# Patient Record
Sex: Female | Born: 1960 | Race: White | Hispanic: No | Marital: Married | Smoking: Never smoker
Health system: Southern US, Community
[De-identification: ages and names within clinical notes are randomized; demographics above are authoritative.]

## PROBLEM LIST (undated history)

## (undated) DIAGNOSIS — T7840XA Allergy, unspecified, initial encounter: Secondary | ICD-10-CM

## (undated) DIAGNOSIS — I10 Essential (primary) hypertension: Secondary | ICD-10-CM

## (undated) DIAGNOSIS — F419 Anxiety disorder, unspecified: Secondary | ICD-10-CM

## (undated) DIAGNOSIS — E785 Hyperlipidemia, unspecified: Secondary | ICD-10-CM

## (undated) HISTORY — DX: Anxiety disorder, unspecified: F41.9

## (undated) HISTORY — DX: Allergy, unspecified, initial encounter: T78.40XA

## (undated) HISTORY — DX: Essential (primary) hypertension: I10

## (undated) HISTORY — PX: WISDOM TOOTH EXTRACTION: SHX21

## (undated) HISTORY — DX: Hyperlipidemia, unspecified: E78.5

---

## 2015-12-01 DIAGNOSIS — G5603 Carpal tunnel syndrome, bilateral upper limbs: Secondary | ICD-10-CM | POA: Diagnosis not present

## 2015-12-01 DIAGNOSIS — M7989 Other specified soft tissue disorders: Secondary | ICD-10-CM | POA: Diagnosis not present

## 2016-01-12 DIAGNOSIS — F419 Anxiety disorder, unspecified: Secondary | ICD-10-CM | POA: Diagnosis not present

## 2016-01-12 DIAGNOSIS — R21 Rash and other nonspecific skin eruption: Secondary | ICD-10-CM | POA: Diagnosis not present

## 2016-03-27 DIAGNOSIS — H6981 Other specified disorders of Eustachian tube, right ear: Secondary | ICD-10-CM | POA: Diagnosis not present

## 2016-03-27 DIAGNOSIS — J029 Acute pharyngitis, unspecified: Secondary | ICD-10-CM | POA: Diagnosis not present

## 2016-04-12 DIAGNOSIS — R1032 Left lower quadrant pain: Secondary | ICD-10-CM | POA: Diagnosis not present

## 2016-05-09 DIAGNOSIS — Z23 Encounter for immunization: Secondary | ICD-10-CM | POA: Diagnosis not present

## 2016-11-07 DIAGNOSIS — R21 Rash and other nonspecific skin eruption: Secondary | ICD-10-CM | POA: Diagnosis not present

## 2016-11-27 DIAGNOSIS — J329 Chronic sinusitis, unspecified: Secondary | ICD-10-CM | POA: Diagnosis not present

## 2016-12-14 DIAGNOSIS — R05 Cough: Secondary | ICD-10-CM | POA: Diagnosis not present

## 2017-03-08 DIAGNOSIS — K047 Periapical abscess without sinus: Secondary | ICD-10-CM | POA: Diagnosis not present

## 2017-05-13 DIAGNOSIS — Z23 Encounter for immunization: Secondary | ICD-10-CM | POA: Diagnosis not present

## 2018-03-10 DIAGNOSIS — J0100 Acute maxillary sinusitis, unspecified: Secondary | ICD-10-CM | POA: Diagnosis not present

## 2018-03-18 DIAGNOSIS — N926 Irregular menstruation, unspecified: Secondary | ICD-10-CM | POA: Diagnosis not present

## 2018-03-18 DIAGNOSIS — Z0000 Encounter for general adult medical examination without abnormal findings: Secondary | ICD-10-CM | POA: Diagnosis not present

## 2018-06-27 DIAGNOSIS — Z6834 Body mass index (BMI) 34.0-34.9, adult: Secondary | ICD-10-CM | POA: Diagnosis not present

## 2018-06-27 DIAGNOSIS — I10 Essential (primary) hypertension: Secondary | ICD-10-CM | POA: Diagnosis not present

## 2018-11-25 DIAGNOSIS — L989 Disorder of the skin and subcutaneous tissue, unspecified: Secondary | ICD-10-CM | POA: Diagnosis not present

## 2020-12-21 DIAGNOSIS — E559 Vitamin D deficiency, unspecified: Secondary | ICD-10-CM | POA: Diagnosis not present

## 2020-12-21 DIAGNOSIS — R5383 Other fatigue: Secondary | ICD-10-CM | POA: Diagnosis not present

## 2020-12-21 DIAGNOSIS — I10 Essential (primary) hypertension: Secondary | ICD-10-CM | POA: Diagnosis not present

## 2020-12-21 DIAGNOSIS — Z79899 Other long term (current) drug therapy: Secondary | ICD-10-CM | POA: Diagnosis not present

## 2020-12-21 DIAGNOSIS — R7303 Prediabetes: Secondary | ICD-10-CM | POA: Diagnosis not present

## 2020-12-26 DIAGNOSIS — E538 Deficiency of other specified B group vitamins: Secondary | ICD-10-CM | POA: Diagnosis not present

## 2020-12-26 DIAGNOSIS — I10 Essential (primary) hypertension: Secondary | ICD-10-CM | POA: Diagnosis not present

## 2020-12-26 DIAGNOSIS — F988 Other specified behavioral and emotional disorders with onset usually occurring in childhood and adolescence: Secondary | ICD-10-CM | POA: Diagnosis not present

## 2020-12-26 DIAGNOSIS — R7303 Prediabetes: Secondary | ICD-10-CM | POA: Diagnosis not present

## 2020-12-28 DIAGNOSIS — E538 Deficiency of other specified B group vitamins: Secondary | ICD-10-CM | POA: Diagnosis not present

## 2021-01-25 DIAGNOSIS — B029 Zoster without complications: Secondary | ICD-10-CM | POA: Diagnosis not present

## 2021-01-31 DIAGNOSIS — E538 Deficiency of other specified B group vitamins: Secondary | ICD-10-CM | POA: Diagnosis not present

## 2021-03-07 DIAGNOSIS — E538 Deficiency of other specified B group vitamins: Secondary | ICD-10-CM | POA: Diagnosis not present

## 2021-04-11 DIAGNOSIS — E538 Deficiency of other specified B group vitamins: Secondary | ICD-10-CM | POA: Diagnosis not present

## 2021-05-12 DIAGNOSIS — F411 Generalized anxiety disorder: Secondary | ICD-10-CM | POA: Diagnosis not present

## 2021-05-12 DIAGNOSIS — I10 Essential (primary) hypertension: Secondary | ICD-10-CM | POA: Diagnosis not present

## 2021-05-12 DIAGNOSIS — R197 Diarrhea, unspecified: Secondary | ICD-10-CM | POA: Diagnosis not present

## 2021-05-15 ENCOUNTER — Encounter: Payer: Self-pay | Admitting: Internal Medicine

## 2021-05-15 DIAGNOSIS — R7303 Prediabetes: Secondary | ICD-10-CM | POA: Diagnosis not present

## 2021-05-15 DIAGNOSIS — R197 Diarrhea, unspecified: Secondary | ICD-10-CM | POA: Diagnosis not present

## 2021-05-24 DIAGNOSIS — R197 Diarrhea, unspecified: Secondary | ICD-10-CM | POA: Diagnosis not present

## 2021-05-31 DIAGNOSIS — E538 Deficiency of other specified B group vitamins: Secondary | ICD-10-CM | POA: Diagnosis not present

## 2021-07-03 DIAGNOSIS — E538 Deficiency of other specified B group vitamins: Secondary | ICD-10-CM | POA: Diagnosis not present

## 2021-07-14 ENCOUNTER — Encounter: Payer: Self-pay | Admitting: Internal Medicine

## 2021-07-18 ENCOUNTER — Encounter: Payer: Self-pay | Admitting: Gastroenterology

## 2021-08-02 DIAGNOSIS — N950 Postmenopausal bleeding: Secondary | ICD-10-CM | POA: Diagnosis not present

## 2021-08-31 DIAGNOSIS — N950 Postmenopausal bleeding: Secondary | ICD-10-CM | POA: Diagnosis not present

## 2021-09-08 ENCOUNTER — Ambulatory Visit (AMBULATORY_SURGERY_CENTER): Payer: Self-pay | Admitting: *Deleted

## 2021-09-08 ENCOUNTER — Other Ambulatory Visit: Payer: Self-pay

## 2021-09-08 VITALS — Ht 62.25 in | Wt 190.0 lb

## 2021-09-08 DIAGNOSIS — Z1211 Encounter for screening for malignant neoplasm of colon: Secondary | ICD-10-CM

## 2021-09-08 MED ORDER — NA SULFATE-K SULFATE-MG SULF 17.5-3.13-1.6 GM/177ML PO SOLN: 1.0000 | Freq: Once | ORAL | 0 refills | Status: AC

## 2021-09-08 NOTE — Progress Notes (Signed)
No egg or soy allergy known to patient  No issues known to pt with past sedation with any surgeries or procedures Patient denies ever being told they had issues or difficulty with intubation  No FH of Malignant Hyperthermia Pt is not on diet pills Pt is not on  home 02  Pt is not on blood thinners  Pt denies issues with constipation  No A fib or A flutter  Pt is  vaccinated  for Covid   Due to the COVID-19 pandemic we are asking patients to follow certain guidelines in PV and the [place]   Pt aware of COVID protocols and [facility] guidelines   PV completed over the phone. Pt verified name, DOB, address and insurance during PV today.  Pt mailed instruction packet with copy of consent form to read and not return, and instructions.  Pt encouraged to call with questions or issues.

## 2021-09-19 DIAGNOSIS — E538 Deficiency of other specified B group vitamins: Secondary | ICD-10-CM | POA: Diagnosis not present

## 2021-09-20 ENCOUNTER — Encounter: Payer: Self-pay | Admitting: Gastroenterology

## 2021-09-22 ENCOUNTER — Ambulatory Visit (AMBULATORY_SURGERY_CENTER): Payer: BC Managed Care – PPO | Admitting: Gastroenterology

## 2021-09-22 ENCOUNTER — Encounter: Payer: Self-pay | Admitting: Gastroenterology

## 2021-09-22 ENCOUNTER — Other Ambulatory Visit: Payer: Self-pay

## 2021-09-22 VITALS — BP 120/62 | HR 78 | Temp 98.6°F | Resp 19 | Ht 62.5 in | Wt 190.0 lb

## 2021-09-22 DIAGNOSIS — Z1211 Encounter for screening for malignant neoplasm of colon: Secondary | ICD-10-CM

## 2021-09-22 DIAGNOSIS — D123 Benign neoplasm of transverse colon: Secondary | ICD-10-CM

## 2021-09-22 DIAGNOSIS — D128 Benign neoplasm of rectum: Secondary | ICD-10-CM | POA: Diagnosis not present

## 2021-09-22 MED ORDER — SODIUM CHLORIDE 0.9 % IV SOLN: 500.0000 mL | Freq: Once | INTRAVENOUS | Status: DC

## 2021-09-22 NOTE — Progress Notes (Signed)
[facility] History and Physical   Primary Care Physician:  Ernestene Kiel, MD   Reason for Procedure:  Colorectal cancer screening  Plan:    Colonoscopy     HPI: Jacqueline Sherman is a 61 y.o. female    Past Medical History:  Diagnosis Date   Allergy    SEASONAL   Anxiety    Hyperlipidemia    BORDERLINE MED'S ONCE WEEKLY   Hypertension     Past Surgical History:  Procedure Laterality Date   CESAREAN SECTION     1996   WISDOM TOOTH EXTRACTION     1991    Prior to Admission medications   Medication Sig Start Date End Date Taking? Authorizing Provider  citalopram (CELEXA) 20 MG tablet Take 1 tablet by mouth daily.   Yes [provider]  cyanocobalamin (,VITAMIN B-12,) 1000 MCG/ML injection SMARTSIG:1 Milliliter(s) Injection Once a Month 08/23/21  Yes [provider]  hydrochlorothiazide (HYDRODIURIL) 12.5 MG tablet Take 12.5 mg by mouth every morning. 08/25/21  Yes [provider]  metoprolol succinate (TOPROL-XL) 50 MG 24 hr tablet Take 50 mg by mouth daily. 08/25/21  Yes [provider]  Vitamin D, Ergocalciferol, (DRISDOL) 1.25 MG (50000 UNIT) CAPS capsule Take by mouth. 08/23/21  Yes [provider]  LORazepam (ATIVAN) 0.5 MG tablet Take by mouth.    [provider]    Current Outpatient Medications  Medication Sig Dispense Refill   citalopram (CELEXA) 20 MG tablet Take 1 tablet by mouth daily.     cyanocobalamin (,VITAMIN B-12,) 1000 MCG/ML injection SMARTSIG:1 Milliliter(s) Injection Once a Month     hydrochlorothiazide (HYDRODIURIL) 12.5 MG tablet Take 12.5 mg by mouth every morning.     metoprolol succinate (TOPROL-XL) 50 MG 24 hr tablet Take 50 mg by mouth daily.     Vitamin D, Ergocalciferol, (DRISDOL) 1.25 MG (50000 UNIT) CAPS capsule Take by mouth.     LORazepam (ATIVAN) 0.5 MG tablet Take by mouth.     Current Facility-Administered Medications  Medication Dose Route Frequency Provider Last  Rate Last Admin   0.9 %  sodium chloride infusion  500 mL Intravenous Once Jackquline Denmark, MD        Allergies as of 09/22/2021   (No Known Allergies)    Family History  Problem Relation Age of Onset   Colon polyps Mother    Colon cancer Neg Hx    Esophageal cancer Neg Hx    Rectal cancer Neg Hx    Stomach cancer Neg Hx     Social History   Socioeconomic History   Marital status: Married    Spouse name: Not on file   Number of children: Not on file   Years of education: Not on file   Highest education level: Not on file  Occupational History   Not on file  Tobacco Use   Smoking status: Never   Smokeless tobacco: Never  Vaping Use   Vaping Use: Never used  Substance and Sexual Activity   Alcohol use: Yes    Comment: OCCASSIONAL   Drug use: Never   Sexual activity: Not on file  Other Topics Concern   Not on file  Social History Narrative   Not on file   Social Determinants of Health   Financial Resource Strain: Not on file  Food Insecurity: Not on file  Transportation Needs: Not on file  Physical Activity: Not on file  Stress: Not on file  Social Connections: Not on file  Intimate Partner Violence:  Not on file    Review of Systems: Positive for none All other review of systems negative except as mentioned in the HPI.  Physical Exam: Vital signs in last 24 hours: @VSRANGES @   General:   Alert,  Well-developed, well-nourished, pleasant and cooperative in NAD Lungs:  Clear throughout to auscultation.   Heart:  Regular rate and rhythm; no murmurs, clicks, rubs,  or gallops. Abdomen:  Soft, nontender and nondistended. Normal bowel sounds.   Neuro/Psych:  Alert and cooperative. Normal mood and affect. A and O x 3    No significant changes were identified.  The patient continues to be an appropriate candidate for the planned procedure and anesthesia.   Carmell Austria, MD. [facility] 09/22/2021 8:06 AM@

## 2021-09-22 NOTE — Progress Notes (Signed)
Called to room to assist during endoscopic procedure.  Patient ID and intended procedure confirmed with present staff. Received instructions for my participation in the procedure from the performing physician.  

## 2021-09-22 NOTE — Progress Notes (Signed)
Pt's states no medical or surgical changes since previsit or office visit.  VS CW  

## 2021-09-22 NOTE — Op Note (Signed)
[facility] Patient Name: Mylissa Lambe Procedure Date: 09/22/2021 7:15 AM MRN: [id number] Endoscopist: Jackquline Denmark , MD Age: 61 Referring MD:  Date of Birth: 1961/01/08 Gender: Female Account #: [id number] Procedure:                Colonoscopy Indications:              Screening for colorectal malignant neoplasm Medicines:                Monitored Anesthesia Care Procedure:                Pre-Anesthesia Assessment:                           - Prior to the procedure, a History and Physical                            was performed, and patient medications and                            allergies were reviewed. The patient's tolerance of                            previous anesthesia was also reviewed. The risks                            and benefits of the procedure and the sedation                            options and risks were discussed with the patient.                            All questions were answered, and informed consent                            was obtained. Prior Anticoagulants: The patient has                            taken no previous anticoagulant or antiplatelet                            agents. ASA Grade Assessment: II - A patient with                            mild systemic disease. After reviewing the risks                            and benefits, the patient was deemed in                            satisfactory condition to undergo the procedure.                           After obtaining informed consent, the colonoscope  was passed under direct vision. Throughout the                            procedure, the patient's blood pressure, pulse, and                            oxygen saturations were monitored continuously. The                            Olympus CF-HQ190L (Serial# [id number]) Colonoscope was                            introduced through the anus and advanced to the the                            cecum,  identified by appendiceal orifice and                            ileocecal valve. The colonoscopy was performed                            without difficulty. The patient tolerated the                            procedure well. The quality of the bowel                            preparation was good. The ileocecal valve,                            appendiceal orifice, and rectum were photographed. Scope In: 8:11:28 AM Scope Out: 8:22:21 AM Scope Withdrawal Time: 0 hours 7 minutes 7 seconds  Total Procedure Duration: 0 hours 10 minutes 53 seconds  Findings:                 Three sessile polyps were found in the proximal                            transverse colon (2) and in the rectum. The polyps                            were 4 to 6 mm in size. These polyps were removed                            with a cold snare. Resection and retrieval were                            complete.                           A few rare (1-2) small-mouthed diverticula were                            found in the sigmoid colon.  Non-bleeding external and internal hemorrhoids were                            found during retroflexion and during perianal exam.                            The hemorrhoids were small.                           The exam was otherwise without abnormality on                            direct and retroflexion views. Complications:            No immediate complications. Estimated Blood Loss:     Estimated blood loss: none. Impression:               - Three 4 to 6 mm polyps in the proximal transverse                            colon/rectum, removed with a cold snare. Resected                            and retrieved.                           - Minimal sigmoid diverticulosis.                           - Non-bleeding external and internal hemorrhoids.                           - The examination was otherwise normal on direct                            and  retroflexion views. Recommendation:           - Patient has a contact number available for                            emergencies. The signs and symptoms of potential                            delayed complications were discussed with the                            patient. Return to normal activities tomorrow.                            Written discharge instructions were provided to the                            patient.                           - Resume previous diet.                           -  Continue present medications.                           - Await pathology results.                           - Repeat colonoscopy for surveillance based on                            pathology results.                           - The findings and recommendations were discussed                            with the patient's family. Jackquline Denmark, MD 09/22/2021 8:29:28 AM This report has been signed electronically.

## 2021-09-22 NOTE — Progress Notes (Signed)
Report given to PACU, vss 

## 2021-09-22 NOTE — Patient Instructions (Signed)
Read all handouts given to you today Await pathology results   YOU HAD AN ENDOSCOPIC PROCEDURE TODAY AT [facility]:   Refer to the procedure report that was given to you for any specific questions about what was found during the examination.  If the procedure report does not answer your questions, please call your gastroenterologist to clarify.  If you requested that your care partner not be given the details of your procedure findings, then the procedure report has been included in a sealed envelope for you to review at your convenience later.  YOU SHOULD EXPECT: Some feelings of bloating in the abdomen. Passage of more gas than usual.  Walking can help get rid of the air that was put into your GI tract during the procedure and reduce the bloating. If you had a lower endoscopy (such as a colonoscopy or flexible sigmoidoscopy) you may notice spotting of blood in your stool or on the toilet paper. If you underwent a bowel prep for your procedure, you may not have a normal bowel movement for a few days.  Please Note:  You might notice some irritation and congestion in your nose or some drainage.  This is from the oxygen used during your procedure.  There is no need for concern and it should clear up in a day or so.  SYMPTOMS TO REPORT IMMEDIATELY:  Following lower endoscopy (colonoscopy or flexible sigmoidoscopy):  Excessive amounts of blood in the stool  Significant tenderness or worsening of abdominal pains  Swelling of the abdomen that is new, acute  Fever of 100F or higher   For urgent or emergent issues, a gastroenterologist can be reached at any hour by calling [phone]. Do not use MyChart messaging for urgent concerns.    DIET:  We do recommend a small meal at first, but then you may proceed to your regular diet.  Drink plenty of fluids but you should avoid alcoholic beverages for 24 hours.  ACTIVITY:  You should plan to take it easy for the rest of today and you  should NOT DRIVE or use heavy machinery until tomorrow (because of the sedation medicines used during the test).    FOLLOW UP: Our staff will call the number listed on your records 48-72 hours following your procedure to check on you and address any questions or concerns that you may have regarding the information given to you following your procedure. If we do not reach you, we will leave a message.  We will attempt to reach you two times.  During this call, we will ask if you have developed any symptoms of COVID 19. If you develop any symptoms (ie: fever, flu-like symptoms, shortness of breath, cough etc.) before then, please call [phone].  If you test positive for Covid 19 in the 2 weeks post procedure, please call and report this information to [place].    If any biopsies were taken you will be contacted by phone or by letter within the next 1-3 weeks.  Please call us at [phone] if you have not heard about the biopsies in 3 weeks.    SIGNATURES/CONFIDENTIALITY: You and/or your care partner have signed paperwork which will be entered into your electronic medical record.  These signatures attest to the fact that that the information above on your After Visit Summary has been reviewed and is understood.  Full responsibility of the confidentiality of this discharge information lies with you and/or your care-partner.

## 2021-09-26 ENCOUNTER — Telehealth: Payer: Self-pay

## 2021-09-26 ENCOUNTER — Other Ambulatory Visit: Payer: Self-pay | Admitting: Internal Medicine

## 2021-09-26 DIAGNOSIS — Z1231 Encounter for screening mammogram for malignant neoplasm of breast: Secondary | ICD-10-CM

## 2021-09-26 NOTE — Telephone Encounter (Signed)
°  Follow up Call-  Call back number 09/22/2021  Post procedure Call Back phone  # [phone]  Permission to leave phone message Yes  Some recent data might be hidden    Left message

## 2021-09-26 NOTE — Telephone Encounter (Signed)
°  Follow up Call-  Call back number 09/22/2021  Post procedure Call Back phone  # [phone]  Permission to leave phone message Yes  Some recent data might be hidden     Attempted to call but no voicemail so no message left

## 2021-09-29 ENCOUNTER — Encounter: Payer: Self-pay | Admitting: Gastroenterology

## 2021-10-10 ENCOUNTER — Ambulatory Visit: Payer: BC Managed Care – PPO

## 2021-10-10 DIAGNOSIS — S025XXB Fracture of tooth (traumatic), initial encounter for open fracture: Secondary | ICD-10-CM | POA: Diagnosis not present

## 2021-10-10 DIAGNOSIS — B3731 Acute candidiasis of vulva and vagina: Secondary | ICD-10-CM | POA: Diagnosis not present

## 2021-10-10 DIAGNOSIS — R0683 Snoring: Secondary | ICD-10-CM | POA: Diagnosis not present

## 2021-10-17 ENCOUNTER — Ambulatory Visit
Admission: RE | Admit: 2021-10-17 | Discharge: 2021-10-17 | Disposition: A | Discharge status: Home / Self Care | Payer: BC Managed Care – PPO | Source: Ambulatory Visit | Attending: Internal Medicine | Admitting: Internal Medicine

## 2021-10-17 DIAGNOSIS — Z1231 Encounter for screening mammogram for malignant neoplasm of breast: Secondary | ICD-10-CM | POA: Diagnosis not present

## 2021-10-21 DIAGNOSIS — G4733 Obstructive sleep apnea (adult) (pediatric): Secondary | ICD-10-CM | POA: Diagnosis not present

## 2021-10-26 DIAGNOSIS — G4733 Obstructive sleep apnea (adult) (pediatric): Secondary | ICD-10-CM | POA: Diagnosis not present

## 2021-10-30 DIAGNOSIS — E538 Deficiency of other specified B group vitamins: Secondary | ICD-10-CM | POA: Diagnosis not present

## 2021-11-26 DIAGNOSIS — G4733 Obstructive sleep apnea (adult) (pediatric): Secondary | ICD-10-CM | POA: Diagnosis not present

## 2021-12-18 DIAGNOSIS — E538 Deficiency of other specified B group vitamins: Secondary | ICD-10-CM | POA: Diagnosis not present

## 2021-12-21 DIAGNOSIS — E669 Obesity, unspecified: Secondary | ICD-10-CM | POA: Diagnosis not present

## 2021-12-21 DIAGNOSIS — Z9989 Dependence on other enabling machines and devices: Secondary | ICD-10-CM | POA: Diagnosis not present

## 2021-12-21 DIAGNOSIS — G4733 Obstructive sleep apnea (adult) (pediatric): Secondary | ICD-10-CM | POA: Diagnosis not present

## 2021-12-26 DIAGNOSIS — G4733 Obstructive sleep apnea (adult) (pediatric): Secondary | ICD-10-CM | POA: Diagnosis not present

## 2022-01-25 DIAGNOSIS — Z6834 Body mass index (BMI) 34.0-34.9, adult: Secondary | ICD-10-CM | POA: Diagnosis not present

## 2022-01-25 DIAGNOSIS — H6991 Unspecified Eustachian tube disorder, right ear: Secondary | ICD-10-CM | POA: Diagnosis not present

## 2022-01-26 DIAGNOSIS — G4733 Obstructive sleep apnea (adult) (pediatric): Secondary | ICD-10-CM | POA: Diagnosis not present

## 2022-02-07 DIAGNOSIS — E538 Deficiency of other specified B group vitamins: Secondary | ICD-10-CM | POA: Diagnosis not present

## 2022-02-25 DIAGNOSIS — G4733 Obstructive sleep apnea (adult) (pediatric): Secondary | ICD-10-CM | POA: Diagnosis not present

## 2022-03-12 DIAGNOSIS — E538 Deficiency of other specified B group vitamins: Secondary | ICD-10-CM | POA: Diagnosis not present

## 2022-03-28 DIAGNOSIS — G4733 Obstructive sleep apnea (adult) (pediatric): Secondary | ICD-10-CM | POA: Diagnosis not present

## 2022-04-28 DIAGNOSIS — G4733 Obstructive sleep apnea (adult) (pediatric): Secondary | ICD-10-CM | POA: Diagnosis not present

## 2022-05-02 DIAGNOSIS — E538 Deficiency of other specified B group vitamins: Secondary | ICD-10-CM | POA: Diagnosis not present

## 2022-05-28 DIAGNOSIS — G4733 Obstructive sleep apnea (adult) (pediatric): Secondary | ICD-10-CM | POA: Diagnosis not present

## 2022-06-21 DIAGNOSIS — E538 Deficiency of other specified B group vitamins: Secondary | ICD-10-CM | POA: Diagnosis not present

## 2022-06-28 DIAGNOSIS — G4733 Obstructive sleep apnea (adult) (pediatric): Secondary | ICD-10-CM | POA: Diagnosis not present

## 2022-07-23 DIAGNOSIS — D519 Vitamin B12 deficiency anemia, unspecified: Secondary | ICD-10-CM | POA: Diagnosis not present

## 2022-07-28 DIAGNOSIS — G4733 Obstructive sleep apnea (adult) (pediatric): Secondary | ICD-10-CM | POA: Diagnosis not present

## 2022-08-28 DIAGNOSIS — E538 Deficiency of other specified B group vitamins: Secondary | ICD-10-CM | POA: Diagnosis not present

## 2022-08-28 DIAGNOSIS — G4733 Obstructive sleep apnea (adult) (pediatric): Secondary | ICD-10-CM | POA: Diagnosis not present

## 2022-08-30 ENCOUNTER — Emergency Department (HOSPITAL_BASED_OUTPATIENT_CLINIC_OR_DEPARTMENT_OTHER)
Admission: EM | Admit: 2022-08-30 | Discharge: 2022-08-30 | Disposition: A | Discharge status: Home / Self Care | Payer: Worker's Compensation | Attending: Emergency Medicine | Admitting: Emergency Medicine

## 2022-08-30 ENCOUNTER — Other Ambulatory Visit: Payer: Self-pay

## 2022-08-30 ENCOUNTER — Encounter (HOSPITAL_BASED_OUTPATIENT_CLINIC_OR_DEPARTMENT_OTHER): Payer: Self-pay | Admitting: Urology

## 2022-08-30 DIAGNOSIS — Z7729 Contact with and (suspected ) exposure to other hazardous substances: Secondary | ICD-10-CM | POA: Insufficient documentation

## 2022-08-30 DIAGNOSIS — I10 Essential (primary) hypertension: Secondary | ICD-10-CM | POA: Diagnosis not present

## 2022-08-30 DIAGNOSIS — Z79899 Other long term (current) drug therapy: Secondary | ICD-10-CM | POA: Insufficient documentation

## 2022-08-30 LAB — CBC WITH DIFFERENTIAL/PLATELET
Abs Immature Granulocytes: 0.05 10*3/uL (ref 0.00–0.07)
Basophils Absolute: 0 10*3/uL (ref 0.0–0.1)
Basophils Relative: 0 %
Eosinophils Absolute: 0.3 10*3/uL (ref 0.0–0.5)
Eosinophils Relative: 2 %
HCT: 41.5 % (ref 36.0–46.0)
Hemoglobin: 14.3 g/dL (ref 12.0–15.0)
Immature Granulocytes: 1 %
Lymphocytes Relative: 31 %
Lymphs Abs: 3.3 10*3/uL (ref 0.7–4.0)
MCH: 31.2 pg (ref 26.0–34.0)
MCHC: 34.5 g/dL (ref 30.0–36.0)
MCV: 90.6 fL (ref 80.0–100.0)
Monocytes Absolute: 0.9 10*3/uL (ref 0.1–1.0)
Monocytes Relative: 8 %
Neutro Abs: 5.9 10*3/uL (ref 1.7–7.7)
Neutrophils Relative %: 58 %
Platelets: 326 10*3/uL (ref 150–400)
RBC: 4.58 MIL/uL (ref 3.87–5.11)
RDW: 12.5 % (ref 11.5–15.5)
WBC: 10.4 10*3/uL (ref 4.0–10.5)
nRBC: 0 % (ref 0.0–0.2)

## 2022-08-30 LAB — COMPREHENSIVE METABOLIC PANEL
ALT: 27 U/L (ref 0–44)
AST: 24 U/L (ref 15–41)
Albumin: 4.2 g/dL (ref 3.5–5.0)
Alkaline Phosphatase: 70 U/L (ref 38–126)
Anion gap: 10 (ref 5–15)
BUN: 15 mg/dL (ref 6–20)
CO2: 26 mmol/L (ref 22–32)
Calcium: 8.7 mg/dL — ABNORMAL LOW (ref 8.9–10.3)
Chloride: 100 mmol/L (ref 98–111)
Creatinine, Ser: 0.67 mg/dL (ref 0.44–1.00)
GFR, Estimated: >60 mL/min (ref >60)
Glucose, Bld: 103 mg/dL — ABNORMAL HIGH (ref 70–99)
Potassium: 3.5 mmol/L (ref 3.5–5.1)
Sodium: 136 mmol/L (ref 135–145)
Total Bilirubin: 0.5 mg/dL (ref 0.3–1.2)
Total Protein: 8.2 g/dL — ABNORMAL HIGH (ref 6.5–8.1)

## 2022-08-30 LAB — I-STAT VENOUS BLOOD GAS, ED
Acid-Base Excess: 2 mmol/L (ref 0.0–2.0)
Bicarbonate: 26.9 mmol/L (ref 20.0–28.0)
Calcium, Ion: 1.1 mmol/L — ABNORMAL LOW (ref 1.15–1.40)
HCT: 43 % (ref 36.0–46.0)
Hemoglobin: 14.6 g/dL (ref 12.0–15.0)
O2 Saturation: 81 %
Potassium: 3.7 mmol/L (ref 3.5–5.1)
Sodium: 139 mmol/L (ref 135–145)
TCO2: 28 mmol/L (ref 22–32)
pCO2, Ven: 40.4 mmHg — ABNORMAL LOW (ref 44–60)
pH, Ven: 7.431 — ABNORMAL HIGH (ref 7.25–7.43)
pO2, Ven: 44 mmHg (ref 32–45)

## 2022-08-30 LAB — CK: Total CK: 72 U/L (ref 38–234)

## 2022-08-30 LAB — CARBOXYHEMOGLOBIN - COOX: Carboxyhemoglobin: 1.4 % (ref 0.5–1.5)

## 2022-08-30 LAB — TROPONIN I (HIGH SENSITIVITY): Troponin I (High Sensitivity): <2 ng/L (ref <18)

## 2022-08-30 LAB — LACTIC ACID, PLASMA: Lactic Acid, Venous: 1.6 mmol/L (ref 0.5–1.9)

## 2022-08-30 NOTE — Discharge Instructions (Signed)
Please return to ED with any new symptoms Please follow-up with PCP

## 2022-08-30 NOTE — ED Provider Notes (Signed)
  Physical Exam  BP (!) 105/58   Pulse 82   Temp 98.7 F (37.1 C) (Oral)   Resp 20   Ht '5\' 3"'$  (1.6 m)   Wt 86.2 kg   SpO2 96%   BMI 33.66 kg/m   Physical Exam Vitals and nursing note reviewed.  Constitutional:      General: She is not in acute distress.    Appearance: She is well-developed.  HENT:     Head: Normocephalic and atraumatic.  Eyes:     Conjunctiva/sclera: Conjunctivae normal.  Cardiovascular:     Rate and Rhythm: Normal rate and regular rhythm.     Heart sounds: No murmur heard. Pulmonary:     Effort: Pulmonary effort is normal. No respiratory distress.     Breath sounds: Normal breath sounds.  Abdominal:     Palpations: Abdomen is soft.     Tenderness: There is no abdominal tenderness.  Musculoskeletal:        General: No swelling.     Cervical back: Neck supple.  Skin:    General: Skin is warm and dry.     Capillary Refill: Capillary refill takes less than 2 seconds.  Neurological:     Mental Status: She is alert.  Psychiatric:        Mood and Affect: Mood normal.     Procedures  Procedures  ED Course / MDM      Medical Decision Making Amount and/or Complexity of Data Reviewed Labs: ordered.   Patient signed out to me at shift change.  Please see previous provider note for further details.  In short, this is a 62 year old female who presents due to carbon monoxide exposure.  Patient reports that she was at her doctor's office earlier today when there was an issue with a heating.  Patient states that at some point, she believes that she was exposed to carbon monoxide and this was confirmed by her doctor in the office.  The patient states that she has been asymptomatic since around 2 or 3:00 this afternoon.  Patient signed out to me pending laboratory workup.  Patient lab work has resulted.  Patient lab work all within normal limits.  No abnormalities noted.  Patient reports that she is ready for discharge.  Patient denies any symptoms at this  time.  Patient will be discharged home.  Patient advised to follow-up with PCP.  Patient encouraged to return to the ED with any new symptoms.  Patient stable.         Azucena Cecil, PA-C 08/30/22 2015    Charlesetta Shanks, MD 08/31/22 1950

## 2022-08-30 NOTE — ED Notes (Signed)
Discharge paperwork reviewed entirely with patient, including Rx's and follow up care. Pain was under control. Pt verbalized understanding as well as all parties involved. No questions or concerns voiced at the time of discharge. No acute distress noted.   The pt went to Room 9 to wait with her coworker until she was discharged.

## 2022-08-30 NOTE — ED Triage Notes (Signed)
This am at 0930  Pt states head wasn't working at work place, he fixed the heat and left and approx 10/20 min later smelled a smell  and had headache, nauseated  States opened windows  Carbon monoxide per heating tech that came though the vents  Denies SOB at this time  Pt states symptoms have resolved at this time

## 2022-08-30 NOTE — ED Provider Notes (Signed)
[facility] DEPART[facility] Provider Note   CSN: [id number] Arrival date & time: 08/30/22  1712     History  Chief Complaint  Patient presents with   Toxic Inhalation    Jacqueline Sherman is a 62 y.o. female.  HPI   62 year old female presents emergency department after carbon monoxide exposure.  Patient states that she was at work earlier today when the heat was not working.  Worker came and who cleared the line and started to the heat.  Patient subsequently noted exhaust fumes and began to feel dizzy, lightheaded, headache 10 to 20 minutes after furnace was started.  They suspected possible carbon oxide poisoning and subsequently open the windows and turned on fans and turned off the furnace at the practice.  Patient states that she has been without symptoms since around 2-3 o'clock this afternoon.  Was sent by physician in practice for full carbon monoxide workup.  Denies current headache, fever, chills, night sweats, chest pain, shortness of breath, abdominal pain, nausea, vomiting, confusion, lightheadedness, dizziness.  Past medical history significant for hyperlipidemia, hypertension, anxiety, allergies  Home Medications Prior to Admission medications   Medication Sig Start Date End Date Taking? Authorizing Provider  citalopram (CELEXA) 20 MG tablet Take 1 tablet by mouth daily.    [provider]  cyanocobalamin (,VITAMIN B-12,) 1000 MCG/ML injection SMARTSIG:1 Milliliter(s) Injection Once a Month 08/23/21   [provider]  hydrochlorothiazide (HYDRODIURIL) 12.5 MG tablet Take 12.5 mg by mouth every morning. 08/25/21   [provider]  LORazepam (ATIVAN) 0.5 MG tablet Take by mouth.    [provider]  metoprolol succinate (TOPROL-XL) 50 MG 24 hr tablet Take 50 mg by mouth daily. 08/25/21   [provider]  Vitamin D, Ergocalciferol, (DRISDOL) 1.25 MG (50000 UNIT) CAPS capsule Take by mouth. 08/23/21   [provider]      Allergies    Patient has no known allergies.    Review of Systems   Review of Systems  All other systems reviewed and are negative.   Physical Exam Updated Vital Signs BP (!) 105/58   Pulse 82   Temp 98.7 F (37.1 C) (Oral)   Resp 20   Ht '5\' 3"'$  (1.6 m)   Wt 86.2 kg   SpO2 96%   BMI 33.66 kg/m  Physical Exam Vitals and nursing note reviewed.  Constitutional:      General: She is not in acute distress.    Appearance: She is well-developed.  HENT:     Head: Normocephalic and atraumatic.  Eyes:     Conjunctiva/sclera: Conjunctivae normal.  Cardiovascular:     Rate and Rhythm: Normal rate and regular rhythm.     Heart sounds: No murmur heard. Pulmonary:     Effort: Pulmonary effort is normal. No respiratory distress.     Breath sounds: Normal breath sounds. No wheezing, rhonchi or rales.  Abdominal:     Palpations: Abdomen is soft.     Tenderness: There is no abdominal tenderness.  Musculoskeletal:        General: No swelling.     Cervical back: Neck supple.  Skin:    General: Skin is warm and dry.     Capillary Refill: Capillary refill takes less than 2 seconds.  Neurological:     Mental Status: She is alert.  Psychiatric:        Mood and Affect: Mood normal.     ED Results / Procedures / Treatments   Labs (all  labs ordered are listed, but only abnormal results are displayed) Labs Reviewed  COMPREHENSIVE METABOLIC PANEL - Abnormal; Notable for the following components:      Result Value   Glucose, Bld 103 (*)    Calcium 8.7 (*)    Total Protein 8.2 (*)    All other components within normal limits  I-STAT VENOUS BLOOD GAS, ED - Abnormal; Notable for the following components:   pH, Ven 7.431 (*)    pCO2, Ven 40.4 (*)    Calcium, Ion 1.10 (*)    All other components within normal limits  CARBOXYHEMOGLOBIN - COOX  CBC WITH DIFFERENTIAL/PLATELET  LACTIC ACID, PLASMA  CK  TROPONIN I (HIGH SENSITIVITY)    EKG None  Radiology No results  found.  Procedures Procedures    Medications Ordered in ED Medications - No data to display  ED Course/ Medical Decision Making/ A&P                              Medical Decision Making Amount and/or Complexity of Data Reviewed Labs: ordered.   This patient presents to the ED for concern of, monoxide exposure, this involves an extensive number of treatment options, and is a complaint that carries with it a high risk of complications and morbidity.  The differential diagnosis includes carbon monoxide exposure   Co morbidities that complicate the patient evaluation  See HPI   Additional history obtained:  Additional history obtained from EMR External records from outside source obtained and reviewed including hospital records   Lab Tests:  I Ordered, and personally interpreted labs.  The pertinent results include: Pending   Imaging Studies ordered:  N/a   Cardiac Monitoring: / EKG:  The patient was maintained on a cardiac monitor.  I personally viewed and interpreted the cardiac monitored which showed an underlying rhythm of: Sinus rhythm without acute ischemic changes   Consultations Obtained:  N/a   Problem List / ED Course / Critical interventions / Medication management  Car monoxide exposure Reevaluation of the patient showed that the patient stayed the same I have reviewed the patients home medicines and have made adjustments as needed   Social Determinants of Health:  Denies tobacco, illicit drug use   Test / Admission - Considered:  Carbon monoxide exposure Vitals signs within normal range and stable throughout visit. Laboratory/imaging studies significant for: See above Patient with evidence of carbon monoxide exposure but is not been symptomatic since 2 PM this afternoon.  Laboratory studies ordered but patient already outside of window of observation for symptomatic carbon monoxide exposure.  Plan is to reassess patient ending laboratory  studies with expecting discharge.  At shift change, patient care handed off to [facility].  Patient stable upon shift change.        Final Clinical Impression(s) / ED Diagnoses Final diagnoses:  Carbon monoxide exposure    Rx / DC Orders ED Discharge Orders     None         Wilnette Kales, [place] 08/31/22 2228    Charlesetta Shanks, MD 09/01/22 [phone]

## 2022-08-30 NOTE — ED Notes (Signed)
RT placed carbon monoxide detector on pt's finger and received a reading of 3 which falls within the normal range.

## 2022-09-24 DIAGNOSIS — I10 Essential (primary) hypertension: Secondary | ICD-10-CM | POA: Diagnosis not present

## 2022-09-24 DIAGNOSIS — R7303 Prediabetes: Secondary | ICD-10-CM | POA: Diagnosis not present

## 2022-09-24 DIAGNOSIS — E538 Deficiency of other specified B group vitamins: Secondary | ICD-10-CM | POA: Diagnosis not present

## 2022-09-24 DIAGNOSIS — E559 Vitamin D deficiency, unspecified: Secondary | ICD-10-CM | POA: Diagnosis not present

## 2022-09-24 DIAGNOSIS — R5383 Other fatigue: Secondary | ICD-10-CM | POA: Diagnosis not present

## 2022-10-03 DIAGNOSIS — G4733 Obstructive sleep apnea (adult) (pediatric): Secondary | ICD-10-CM | POA: Diagnosis not present

## 2022-10-03 DIAGNOSIS — E785 Hyperlipidemia, unspecified: Secondary | ICD-10-CM | POA: Diagnosis not present

## 2022-10-03 DIAGNOSIS — R7303 Prediabetes: Secondary | ICD-10-CM | POA: Diagnosis not present

## 2022-10-03 DIAGNOSIS — I10 Essential (primary) hypertension: Secondary | ICD-10-CM | POA: Diagnosis not present

## 2022-10-04 DIAGNOSIS — E538 Deficiency of other specified B group vitamins: Secondary | ICD-10-CM | POA: Diagnosis not present

## 2022-11-05 DIAGNOSIS — E538 Deficiency of other specified B group vitamins: Secondary | ICD-10-CM | POA: Diagnosis not present

## 2022-12-10 DIAGNOSIS — E538 Deficiency of other specified B group vitamins: Secondary | ICD-10-CM | POA: Diagnosis not present

## 2023-01-10 DIAGNOSIS — E538 Deficiency of other specified B group vitamins: Secondary | ICD-10-CM | POA: Diagnosis not present

## 2023-02-07 DIAGNOSIS — E538 Deficiency of other specified B group vitamins: Secondary | ICD-10-CM | POA: Diagnosis not present

## 2023-03-04 DIAGNOSIS — M2550 Pain in unspecified joint: Secondary | ICD-10-CM | POA: Diagnosis not present

## 2023-03-04 DIAGNOSIS — Z6834 Body mass index (BMI) 34.0-34.9, adult: Secondary | ICD-10-CM | POA: Diagnosis not present

## 2023-03-04 DIAGNOSIS — R7303 Prediabetes: Secondary | ICD-10-CM | POA: Diagnosis not present

## 2023-03-04 DIAGNOSIS — M79672 Pain in left foot: Secondary | ICD-10-CM | POA: Diagnosis not present

## 2023-03-05 DIAGNOSIS — M2550 Pain in unspecified joint: Secondary | ICD-10-CM | POA: Diagnosis not present

## 2023-03-28 DIAGNOSIS — E538 Deficiency of other specified B group vitamins: Secondary | ICD-10-CM | POA: Diagnosis not present

## 2023-04-30 DIAGNOSIS — E538 Deficiency of other specified B group vitamins: Secondary | ICD-10-CM | POA: Diagnosis not present

## 2023-05-24 DIAGNOSIS — L821 Other seborrheic keratosis: Secondary | ICD-10-CM | POA: Diagnosis not present

## 2023-05-24 DIAGNOSIS — L738 Other specified follicular disorders: Secondary | ICD-10-CM | POA: Diagnosis not present

## 2023-05-24 DIAGNOSIS — L814 Other melanin hyperpigmentation: Secondary | ICD-10-CM | POA: Diagnosis not present

## 2023-06-03 DIAGNOSIS — E538 Deficiency of other specified B group vitamins: Secondary | ICD-10-CM | POA: Diagnosis not present

## 2023-06-04 DIAGNOSIS — S60021A Contusion of right index finger without damage to nail, initial encounter: Secondary | ICD-10-CM | POA: Diagnosis not present

## 2023-07-04 DIAGNOSIS — E538 Deficiency of other specified B group vitamins: Secondary | ICD-10-CM | POA: Diagnosis not present

## 2023-07-25 DIAGNOSIS — I10 Essential (primary) hypertension: Secondary | ICD-10-CM | POA: Diagnosis not present

## 2023-07-25 DIAGNOSIS — R7303 Prediabetes: Secondary | ICD-10-CM | POA: Diagnosis not present

## 2023-07-25 DIAGNOSIS — H6993 Unspecified Eustachian tube disorder, bilateral: Secondary | ICD-10-CM | POA: Diagnosis not present

## 2023-07-25 DIAGNOSIS — F411 Generalized anxiety disorder: Secondary | ICD-10-CM | POA: Diagnosis not present

## 2023-07-29 DIAGNOSIS — I10 Essential (primary) hypertension: Secondary | ICD-10-CM | POA: Diagnosis not present

## 2023-07-29 DIAGNOSIS — E785 Hyperlipidemia, unspecified: Secondary | ICD-10-CM | POA: Diagnosis not present

## 2023-07-29 DIAGNOSIS — E559 Vitamin D deficiency, unspecified: Secondary | ICD-10-CM | POA: Diagnosis not present

## 2023-07-29 DIAGNOSIS — R7303 Prediabetes: Secondary | ICD-10-CM | POA: Diagnosis not present

## 2023-08-05 DIAGNOSIS — E538 Deficiency of other specified B group vitamins: Secondary | ICD-10-CM | POA: Diagnosis not present

## 2023-08-19 DIAGNOSIS — G4733 Obstructive sleep apnea (adult) (pediatric): Secondary | ICD-10-CM | POA: Diagnosis not present

## 2023-09-18 DIAGNOSIS — E538 Deficiency of other specified B group vitamins: Secondary | ICD-10-CM | POA: Diagnosis not present

## 2023-09-20 IMAGING — MG MM DIGITAL SCREENING BILAT W/ TOMO AND CAD
8 series · 8 of 24 positions shown · non-contrast
Comparison: Previous exam(s).

CLINICAL DATA: Screening.

EXAM:
DIGITAL SCREENING BILATERAL MAMMOGRAM WITH TOMOSYNTHESIS AND CAD
TECHNIQUE: Bilateral screening digital craniocaudal and mediolateral oblique
mammograms were obtained. Bilateral screening digital breast
tomosynthesis was performed. The images were evaluated with
computer-aided detection.

[R MLO synth-2D]
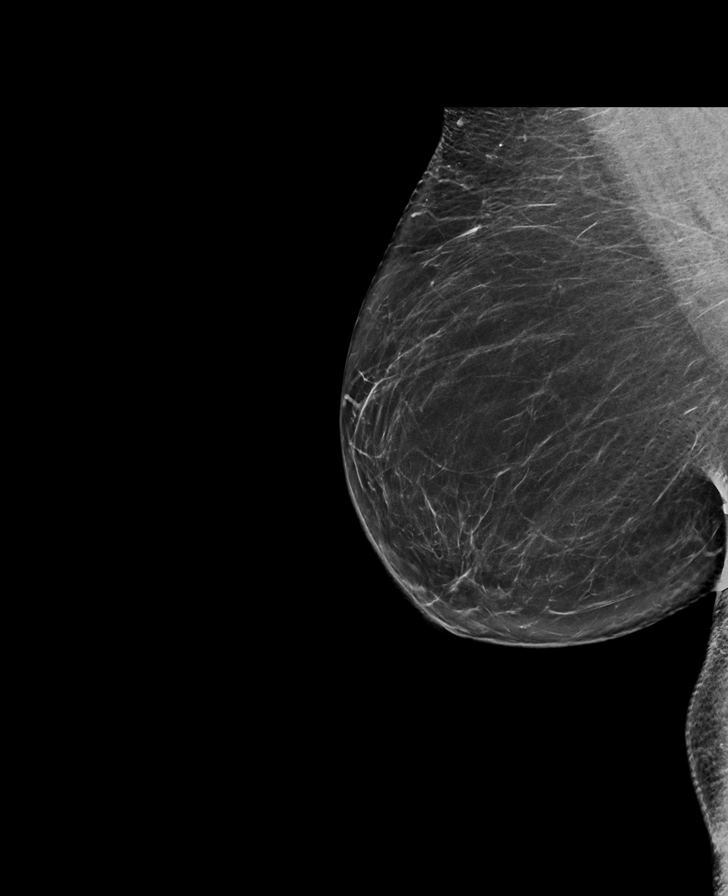

[R CC synth-2D]
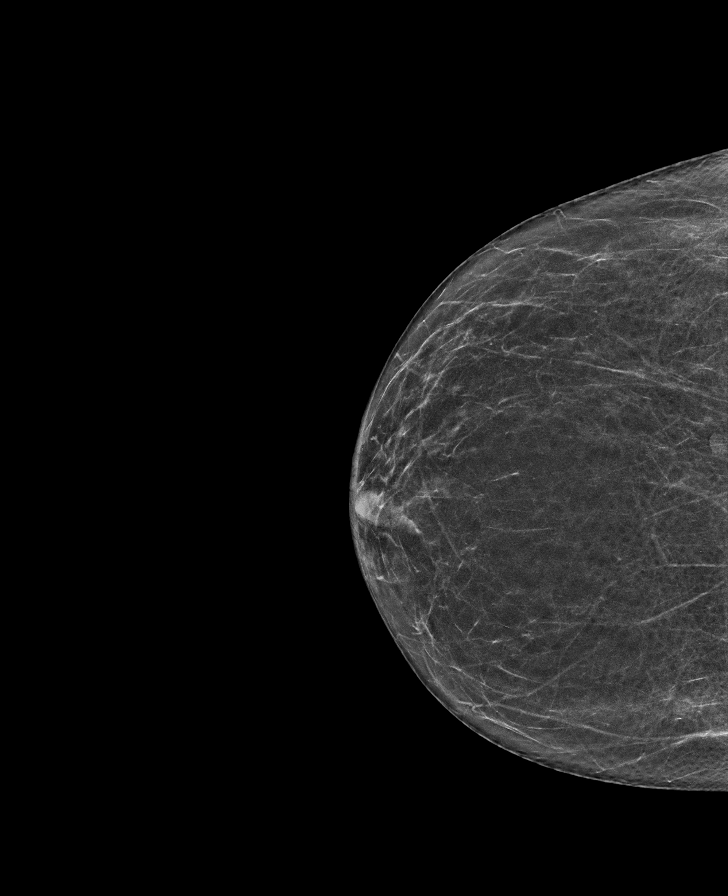

[L MLO synth-2D]
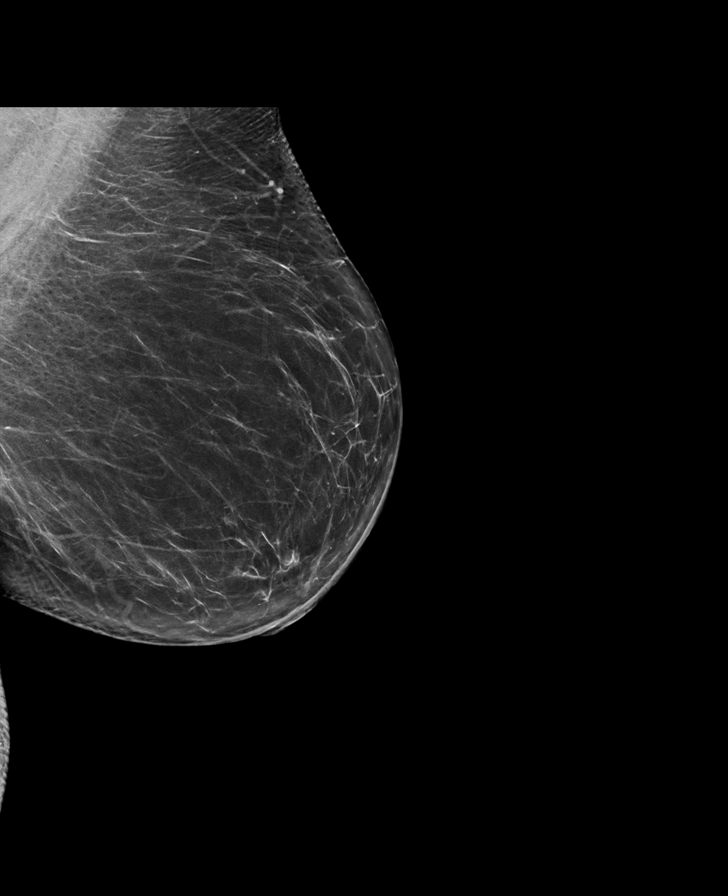

[L CC synth-2D]
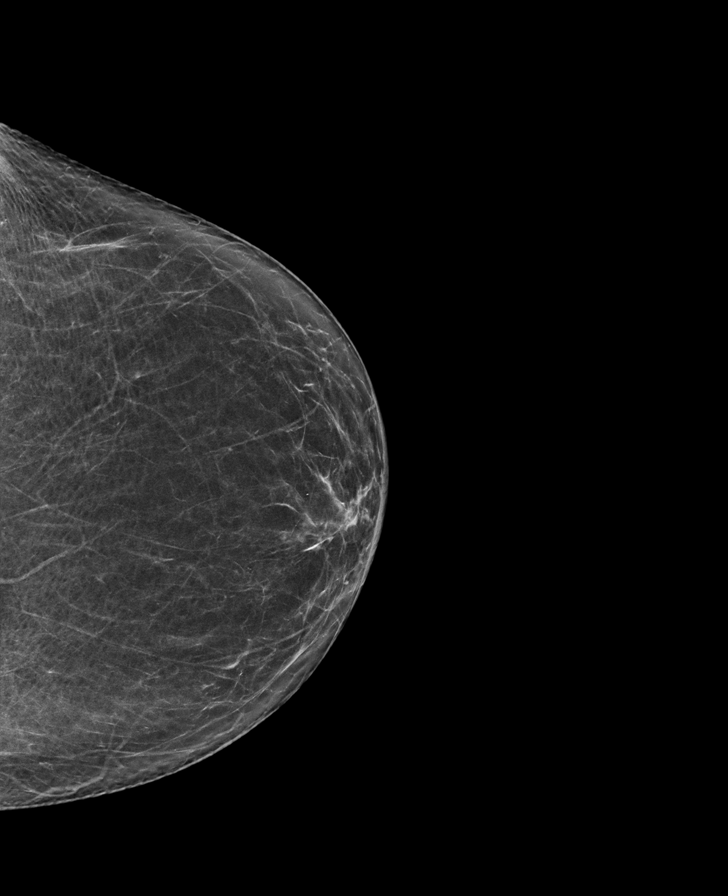

[L MLO tomo · tomo slice 39/78.0]
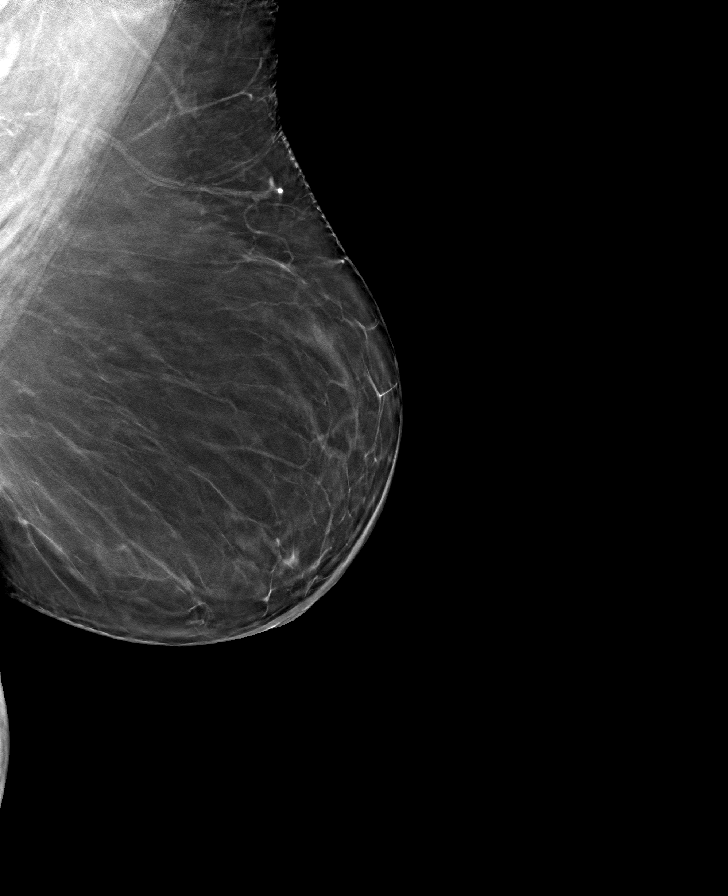

[R CC tomo · tomo slice 30/59.0]
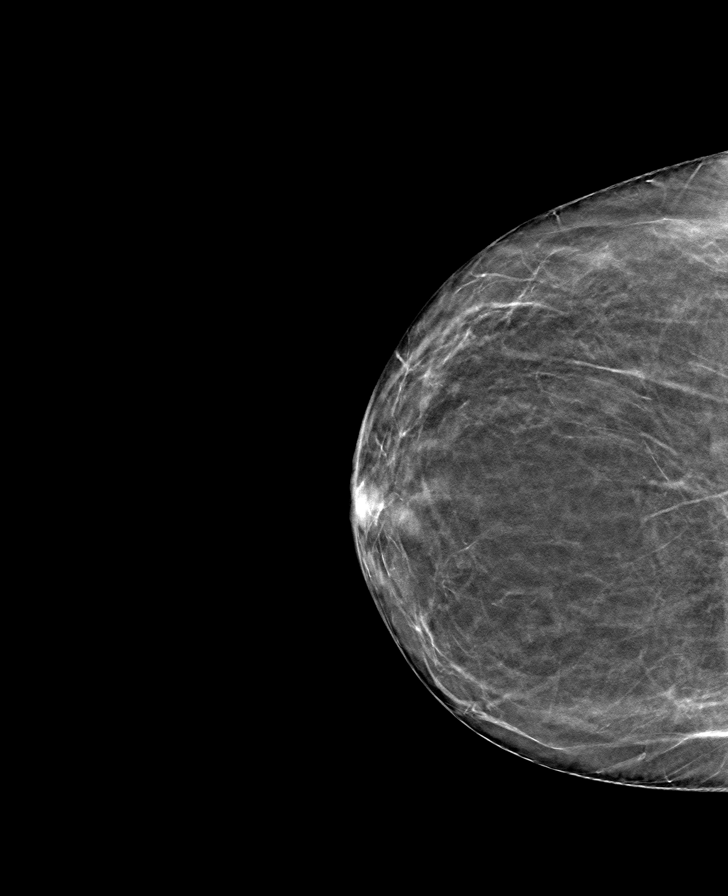

[L CC tomo · tomo slice 33/65.0]
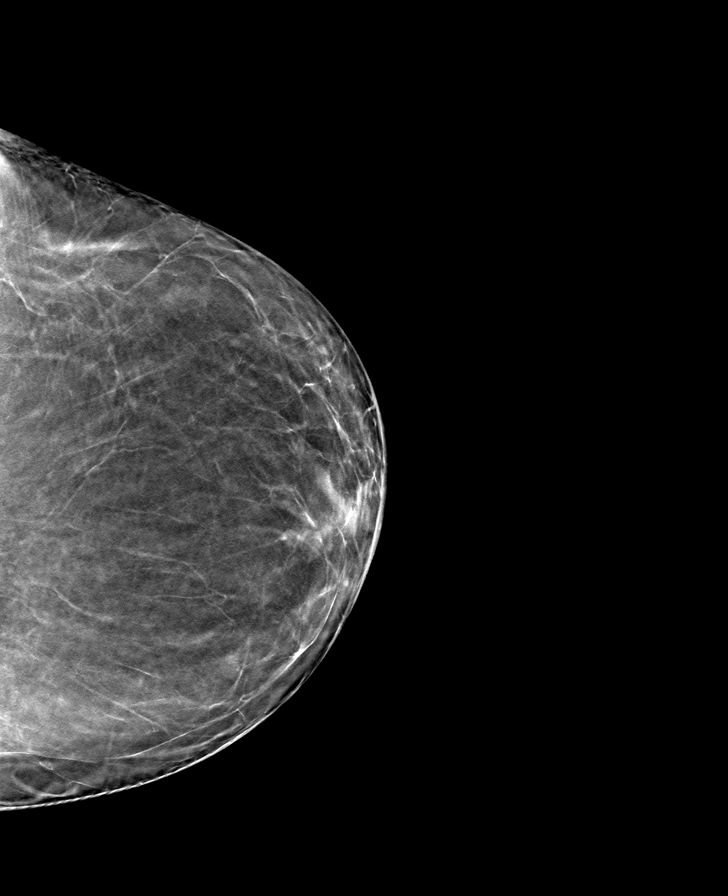

[R MLO tomo · tomo slice 39/78.0]
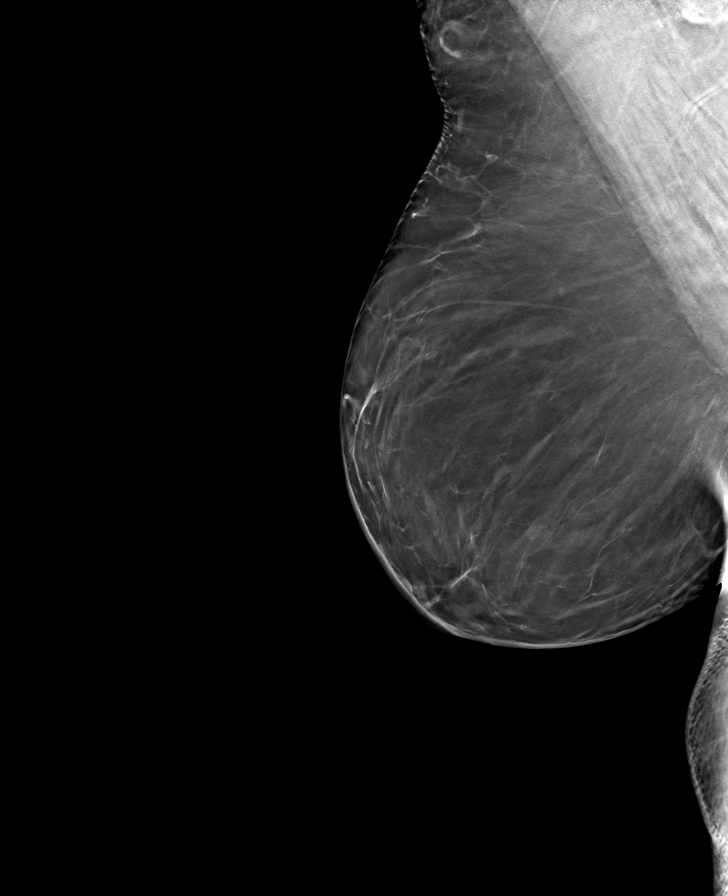

[8 of 24 positions shown; findings below may reference images not displayed]

ACR Breast Density Category b: There are scattered areas of
fibroglandular density.
FINDINGS: There are no findings suspicious for malignancy.
IMPRESSION: No mammographic evidence of malignancy. A result letter of this
screening mammogram will be mailed directly to the patient.

RECOMMENDATION:
Screening mammogram in one year. (Code:51-O-LD2)

BI-RADS CATEGORY  1: Negative.

## 2023-10-23 DIAGNOSIS — E538 Deficiency of other specified B group vitamins: Secondary | ICD-10-CM | POA: Diagnosis not present

## 2023-10-29 DIAGNOSIS — R7303 Prediabetes: Secondary | ICD-10-CM | POA: Diagnosis not present

## 2023-10-29 DIAGNOSIS — R635 Abnormal weight gain: Secondary | ICD-10-CM | POA: Diagnosis not present

## 2023-11-27 DIAGNOSIS — E538 Deficiency of other specified B group vitamins: Secondary | ICD-10-CM | POA: Diagnosis not present

## 2023-12-12 DIAGNOSIS — L989 Disorder of the skin and subcutaneous tissue, unspecified: Secondary | ICD-10-CM | POA: Diagnosis not present

## 2023-12-12 DIAGNOSIS — L299 Pruritus, unspecified: Secondary | ICD-10-CM | POA: Diagnosis not present

## 2023-12-12 DIAGNOSIS — Z6834 Body mass index (BMI) 34.0-34.9, adult: Secondary | ICD-10-CM | POA: Diagnosis not present

## 2024-01-08 DIAGNOSIS — E538 Deficiency of other specified B group vitamins: Secondary | ICD-10-CM | POA: Diagnosis not present

## 2024-01-14 DIAGNOSIS — B372 Candidiasis of skin and nail: Secondary | ICD-10-CM | POA: Diagnosis not present

## 2024-01-14 DIAGNOSIS — R21 Rash and other nonspecific skin eruption: Secondary | ICD-10-CM | POA: Diagnosis not present

## 2024-01-14 DIAGNOSIS — Z6834 Body mass index (BMI) 34.0-34.9, adult: Secondary | ICD-10-CM | POA: Diagnosis not present

## 2024-01-30 DIAGNOSIS — I10 Essential (primary) hypertension: Secondary | ICD-10-CM | POA: Diagnosis not present

## 2024-01-30 DIAGNOSIS — E785 Hyperlipidemia, unspecified: Secondary | ICD-10-CM | POA: Diagnosis not present

## 2024-03-18 DIAGNOSIS — E538 Deficiency of other specified B group vitamins: Secondary | ICD-10-CM | POA: Diagnosis not present

## 2024-04-27 DIAGNOSIS — E538 Deficiency of other specified B group vitamins: Secondary | ICD-10-CM | POA: Diagnosis not present

## 2024-06-10 DIAGNOSIS — E538 Deficiency of other specified B group vitamins: Secondary | ICD-10-CM | POA: Diagnosis not present

## 2024-07-13 DIAGNOSIS — R7303 Prediabetes: Secondary | ICD-10-CM | POA: Diagnosis not present
